# Patient Record
Sex: Male | Born: 1960 | Race: White | Hispanic: No | Smoking: Current every day smoker
Health system: Southern US, Community
[De-identification: ages and names within clinical notes are randomized; demographics above are authoritative.]

## PROBLEM LIST (undated history)

## (undated) DIAGNOSIS — J302 Other seasonal allergic rhinitis: Secondary | ICD-10-CM

## (undated) DIAGNOSIS — G4730 Sleep apnea, unspecified: Secondary | ICD-10-CM

## (undated) HISTORY — PX: TRIGGER FINGER RELEASE: SHX641

## (undated) HISTORY — PX: APPENDECTOMY: SHX54

---

## 2007-06-08 ENCOUNTER — Emergency Department (HOSPITAL_COMMUNITY): Admission: EM | Admit: 2007-06-08 | Discharge: 2007-06-08 | Payer: Self-pay | Admitting: Emergency Medicine

## 2010-10-31 ENCOUNTER — Emergency Department (HOSPITAL_COMMUNITY): Admission: EM | Admit: 2010-10-31 | Discharge: 2010-11-01 | Payer: Self-pay | Admitting: Emergency Medicine

## 2013-09-14 ENCOUNTER — Other Ambulatory Visit: Payer: Self-pay | Admitting: Otolaryngology

## 2013-09-18 NOTE — Addendum Note (Signed)
Addended by: Annalee Genta, Emonii Wienke on: 09/18/2013 05:45 PM   Modules accepted: Orders

## 2013-10-19 ENCOUNTER — Encounter (HOSPITAL_COMMUNITY): Payer: Self-pay | Admitting: Pharmacy Technician

## 2013-10-20 ENCOUNTER — Other Ambulatory Visit (HOSPITAL_COMMUNITY): Payer: Self-pay | Admitting: *Deleted

## 2013-10-20 NOTE — Pre-Procedure Instructions (Signed)
Gary Meyers  10/20/2013   Your procedure is scheduled on:  Wednesday, November 01, 2013 at 8:30 AM.   Report to [facility] Entrance "A" at 6:30 AM.   Call this number if you have problems the morning of surgery: [phone]   Remember:   Do not eat food or drink liquids after midnight Tuesday, 10/31/13.   Take these medicines the morning of surgery with A SIP OF WATER: azelastine (ASTELIN) 137 MCG/SPRAY, Aclidinium Bromide (TUDORZA PRESSAIR)    Do not wear jewelry.  Do not wear lotions, powders, or cologne. You may wear deodorant.  Do not shave 48 hours prior to surgery. Men may shave face and neck.  Do not bring valuables to the hospital.  [facility] is not responsible                  for any belongings or valuables.               Contacts, dentures or bridgework may not be worn into surgery.  Leave suitcase in the car. After surgery it may be brought to your room.  For patients admitted to the hospital, discharge time is determined by your                treatment team.             Special Instructions: Shower using CHG 2 nights before surgery and the night before surgery.  If you shower the day of surgery use CHG.  Use special wash - you have one bottle of CHG for all showers.  You should use approximately 1/3 of the bottle for each shower.   Please read over the following fact sheets that you were given: Pain Booklet, Coughing and Deep Breathing and Surgical Site Infection Prevention

## 2013-10-23 ENCOUNTER — Encounter (HOSPITAL_COMMUNITY)
Admission: RE | Admit: 2013-10-23 | Discharge: 2013-10-23 | Disposition: A | Discharge status: Home / Self Care | Source: Ambulatory Visit | Attending: Anesthesiology | Admitting: Anesthesiology

## 2013-10-23 ENCOUNTER — Encounter (HOSPITAL_COMMUNITY): Payer: Self-pay

## 2013-10-23 ENCOUNTER — Encounter (HOSPITAL_COMMUNITY)
Admission: RE | Admit: 2013-10-23 | Discharge: 2013-10-23 | Disposition: A | Discharge status: Home / Self Care | Source: Ambulatory Visit | Attending: Otolaryngology | Admitting: Otolaryngology

## 2013-10-23 DIAGNOSIS — Z01818 Encounter for other preprocedural examination: Secondary | ICD-10-CM | POA: Insufficient documentation

## 2013-10-23 DIAGNOSIS — Z01810 Encounter for preprocedural cardiovascular examination: Secondary | ICD-10-CM | POA: Insufficient documentation

## 2013-10-23 DIAGNOSIS — Z01812 Encounter for preprocedural laboratory examination: Secondary | ICD-10-CM | POA: Insufficient documentation

## 2013-10-23 HISTORY — DX: Other seasonal allergic rhinitis: J30.2

## 2013-10-23 HISTORY — DX: Sleep apnea, unspecified: G47.30

## 2013-10-23 LAB — CBC
Hemoglobin: 15.7 g/dL (ref 13.0–17.0)
MCV: 94.5 fL (ref 78.0–100.0)
Platelets: 200 10*3/uL (ref 150–400)
RBC: 4.69 MIL/uL (ref 4.22–5.81)
WBC: 6.3 10*3/uL (ref 4.0–10.5)

## 2013-10-23 LAB — BASIC METABOLIC PANEL
BUN: 14 mg/dL (ref 6–23)
CO2: 28 mEq/L (ref 19–32)
Calcium: 9.6 mg/dL (ref 8.4–10.5)
Chloride: 101 mEq/L (ref 96–112)
GFR calc non Af Amer: 73 mL/min — ABNORMAL LOW (ref >90)
Potassium: 4 mEq/L (ref 3.5–5.1)
Sodium: 139 mEq/L (ref 135–145)

## 2013-10-23 NOTE — Progress Notes (Signed)
Anesthesia Chart Review: Patient is a 52 year old male scheduled for nasal septoplasty and bilateral inferior turbinate reduction on 11/01/13 by Dr. Annalee Genta.  History includes OSA, seasonal allergies, smoking, appendectomy, trigger finger release.  He does not have a regular PCP.    EKG on 10/23/13 showed NSR.  CXR on 10/23/13 showed no radiographic evidence of acute cardiopulmonary disease.  Preoperative labs noted.  Preoperative diagnostic studies appear acceptable for OR.  Patient instructed by his PAT RN to bring CPAP mask on the day of surgery.    Velna Ochs [facility] Stay [facility]/Anesthesiology Phone [phone] 10/23/2013 3:07 PM

## 2013-10-23 NOTE — Progress Notes (Signed)
Instructed to bring CPAP mask day of surgery. Patient does not have a PCP. Requested sleep study from [facility].

## 2013-10-30 ENCOUNTER — Other Ambulatory Visit: Payer: Self-pay | Admitting: Otolaryngology

## 2013-10-31 MED ORDER — CEFAZOLIN SODIUM-DEXTROSE 2-3 GM-% IV SOLR
2.0000 g | INTRAVENOUS | Status: AC
Start: 2013-11-01 — End: 2013-11-01
  Administered 2013-11-01: 2 g via INTRAVENOUS
  Filled 2013-10-31: qty 50

## 2013-11-01 ENCOUNTER — Ambulatory Visit (HOSPITAL_COMMUNITY): Admitting: Anesthesiology

## 2013-11-01 ENCOUNTER — Encounter (HOSPITAL_COMMUNITY)
Admission: RE | Disposition: A | Discharge status: Home / Self Care | Payer: Self-pay | Source: Ambulatory Visit | Attending: Otolaryngology

## 2013-11-01 ENCOUNTER — Encounter (HOSPITAL_COMMUNITY): Admitting: Vascular Surgery

## 2013-11-01 ENCOUNTER — Encounter (HOSPITAL_COMMUNITY): Payer: Self-pay | Admitting: Anesthesiology

## 2013-11-01 ENCOUNTER — Ambulatory Visit (HOSPITAL_COMMUNITY)
Admission: RE | Admit: 2013-11-01 | Discharge: 2013-11-01 | Disposition: A | Discharge status: Home / Self Care | Source: Ambulatory Visit | Attending: Otolaryngology | Admitting: Otolaryngology

## 2013-11-01 DIAGNOSIS — G4733 Obstructive sleep apnea (adult) (pediatric): Secondary | ICD-10-CM | POA: Insufficient documentation

## 2013-11-01 DIAGNOSIS — J342 Deviated nasal septum: Secondary | ICD-10-CM | POA: Insufficient documentation

## 2013-11-01 DIAGNOSIS — J343 Hypertrophy of nasal turbinates: Secondary | ICD-10-CM | POA: Insufficient documentation

## 2013-11-01 HISTORY — PX: NASAL SEPTOPLASTY W/ TURBINOPLASTY: SHX2070

## 2013-11-01 SURGERY — SEPTOPLASTY, NOSE, WITH NASAL TURBINATE REDUCTION
Anesthesia: General | Site: Nose | Laterality: Bilateral | Wound class: Clean Contaminated

## 2013-11-01 MED ORDER — ROCURONIUM BROMIDE 100 MG/10ML IV SOLN
INTRAVENOUS | Status: DC | PRN
  Administered 2013-11-01: 50 mg via INTRAVENOUS

## 2013-11-01 MED ORDER — OXYCODONE HCL 5 MG/5ML PO SOLN: 5.0000 mg | Freq: Once | ORAL | Status: DC | PRN

## 2013-11-01 MED ORDER — NEOSTIGMINE METHYLSULFATE 1 MG/ML IJ SOLN
INTRAMUSCULAR | Status: DC | PRN
  Administered 2013-11-01: 4 mg via INTRAVENOUS

## 2013-11-01 MED ORDER — PROPOFOL 10 MG/ML IV BOLUS
INTRAVENOUS | Status: DC | PRN
  Administered 2013-11-01: 200 mg via INTRAVENOUS

## 2013-11-01 MED ORDER — LIDOCAINE HCL (CARDIAC) 20 MG/ML IV SOLN
INTRAVENOUS | Status: DC | PRN
  Administered 2013-11-01: 100 mg via INTRAVENOUS

## 2013-11-01 MED ORDER — MIDAZOLAM HCL 5 MG/5ML IJ SOLN
INTRAMUSCULAR | Status: DC | PRN
  Administered 2013-11-01: 2 mg via INTRAVENOUS

## 2013-11-01 MED ORDER — HYDROMORPHONE HCL PF 1 MG/ML IJ SOLN
0.2500 mg | INTRAMUSCULAR | Status: DC | PRN
  Administered 2013-11-01 (×2): 0.5 mg via INTRAVENOUS

## 2013-11-01 MED ORDER — DEXAMETHASONE SODIUM PHOSPHATE 10 MG/ML IJ SOLN
10.0000 mg | Freq: Once | INTRAMUSCULAR | Status: AC
  Administered 2013-11-01: 10 mg via INTRAVENOUS
  Filled 2013-11-01: qty 1

## 2013-11-01 MED ORDER — MUPIROCIN CALCIUM 2 % EX CREA
TOPICAL_CREAM | CUTANEOUS | Status: AC
  Filled 2013-11-01: qty 15

## 2013-11-01 MED ORDER — OXYMETAZOLINE HCL 0.05 % NA SOLN
NASAL | Status: DC | PRN
  Administered 2013-11-01: 1 via NASAL

## 2013-11-01 MED ORDER — ONDANSETRON HCL 4 MG/2ML IJ SOLN: 4.0000 mg | Freq: Once | INTRAMUSCULAR | Status: DC | PRN

## 2013-11-01 MED ORDER — LIDOCAINE-EPINEPHRINE 1 %-1:100000 IJ SOLN
INTRAMUSCULAR | Status: AC
  Filled 2013-11-01: qty 1

## 2013-11-01 MED ORDER — HYDROCODONE-ACETAMINOPHEN 5-325 MG PO TABS: 1.0000 | ORAL_TABLET | Freq: Four times a day (QID) | ORAL | Status: AC | PRN

## 2013-11-01 MED ORDER — LIDOCAINE-EPINEPHRINE 1 %-1:100000 IJ SOLN
INTRAMUSCULAR | Status: DC | PRN
  Administered 2013-11-01: 10 mL

## 2013-11-01 MED ORDER — 0.9 % SODIUM CHLORIDE (POUR BTL) OPTIME
TOPICAL | Status: DC | PRN
  Administered 2013-11-01: 1000 mL

## 2013-11-01 MED ORDER — LACTATED RINGERS IV SOLN
INTRAVENOUS | Status: DC | PRN
  Administered 2013-11-01: 08:00:00 via INTRAVENOUS

## 2013-11-01 MED ORDER — OXYMETAZOLINE HCL 0.05 % NA SOLN
NASAL | Status: AC
  Filled 2013-11-01: qty 15

## 2013-11-01 MED ORDER — FENTANYL CITRATE 0.05 MG/ML IJ SOLN
INTRAMUSCULAR | Status: DC | PRN
  Administered 2013-11-01: 100 ug via INTRAVENOUS

## 2013-11-01 MED ORDER — OXYCODONE HCL 5 MG PO TABS: 5.0000 mg | ORAL_TABLET | Freq: Once | ORAL | Status: DC | PRN

## 2013-11-01 MED ORDER — MUPIROCIN CALCIUM 2 % EX CREA
TOPICAL_CREAM | CUTANEOUS | Status: DC | PRN
  Administered 2013-11-01: 1 via TOPICAL

## 2013-11-01 MED ORDER — GLYCOPYRROLATE 0.2 MG/ML IJ SOLN
INTRAMUSCULAR | Status: DC | PRN
  Administered 2013-11-01: 0.6 mg via INTRAVENOUS

## 2013-11-01 MED ORDER — MEPERIDINE HCL 25 MG/ML IJ SOLN: 6.2500 mg | INTRAMUSCULAR | Status: DC | PRN

## 2013-11-01 MED ORDER — ONDANSETRON HCL 4 MG/2ML IJ SOLN
INTRAMUSCULAR | Status: DC | PRN
  Administered 2013-11-01: 4 mg via INTRAVENOUS

## 2013-11-01 MED ORDER — AMOXICILLIN-POT CLAVULANATE 500-125 MG PO TABS: 1.0000 | ORAL_TABLET | Freq: Two times a day (BID) | ORAL | Status: AC

## 2013-11-01 SURGICAL SUPPLY — 24 items
CANISTER SUCTION 2500CC (MISCELLANEOUS) ×2 IMPLANT
COAGULATOR SUCT SWTCH 10FR 6 (ELECTROSURGICAL) IMPLANT
ELECT REM PT RETURN 9FT ADLT (ELECTROSURGICAL) ×2
ELECTRODE REM PT RTRN 9FT ADLT (ELECTROSURGICAL) IMPLANT
GAUZE SPONGE 2X2 8PLY STRL LF (GAUZE/BANDAGES/DRESSINGS) ×1 IMPLANT
GLOVE BIOGEL M 7.0 STRL (GLOVE) ×4 IMPLANT
GLOVE BIOGEL PI IND STRL 6.5 (GLOVE) IMPLANT
GLOVE BIOGEL PI INDICATOR 6.5 (GLOVE) ×1
GLOVE SURG SS PI 6.5 STRL IVOR (GLOVE) ×1 IMPLANT
GOWN STRL NON-REIN LRG LVL3 (GOWN DISPOSABLE) ×4 IMPLANT
KIT BASIN OR (CUSTOM PROCEDURE TRAY) ×2 IMPLANT
KIT ROOM TURNOVER OR (KITS) ×2 IMPLANT
NS IRRIG 1000ML POUR BTL (IV SOLUTION) ×2 IMPLANT
PAD ARMBOARD 7.5X6 YLW CONV (MISCELLANEOUS) ×3 IMPLANT
SPLINT NASAL DOYLE BI-VL (GAUZE/BANDAGES/DRESSINGS) ×2 IMPLANT
SPONGE GAUZE 2X2 STER 10/PKG (GAUZE/BANDAGES/DRESSINGS)
SPONGE NEURO XRAY DETECT 1X3 (DISPOSABLE) ×2 IMPLANT
SUT ETHILON 3 0 PS 1 (SUTURE) ×2 IMPLANT
SUT PLAIN 4 0 ~~LOC~~ 1 (SUTURE) ×2 IMPLANT
TOWEL OR 17X24 6PK STRL BLUE (TOWEL DISPOSABLE) ×2 IMPLANT
TOWEL OR 17X26 10 PK STRL BLUE (TOWEL DISPOSABLE) ×2 IMPLANT
TRAY ENT MC OR (CUSTOM PROCEDURE TRAY) ×2 IMPLANT
TUBE SALEM SUMP 16 FR W/ARV (TUBING) ×2 IMPLANT
TUBING EXTENTION W/L.L. (IV SETS) ×2 IMPLANT

## 2013-11-01 NOTE — Preoperative (Signed)
Beta Blockers   Reason not to administer Beta Blockers:Not Applicable 

## 2013-11-01 NOTE — Anesthesia Preprocedure Evaluation (Addendum)
Anesthesia Evaluation  Patient identified by MRN, date of birth, ID band Patient awake    Reviewed: Allergy & Precautions, H&P , NPO status , Patient's Chart, lab work & pertinent test results  Airway Mallampati: II TM Distance: >3 FB Neck ROM: Full    Dental  (+) Teeth Intact   Pulmonary sleep apnea and Continuous Positive Airway Pressure Ventilation , Current Smoker,          Cardiovascular     Neuro/Psych    GI/Hepatic   Endo/Other    Renal/GU      Musculoskeletal   Abdominal   Peds  Hematology   Anesthesia Other Findings   Reproductive/Obstetrics                         Anesthesia Physical Anesthesia Plan  ASA: II  Anesthesia Plan: General   Post-op Pain Management:    Induction: Intravenous  Airway Management Planned: Oral ETT  Additional Equipment:   Intra-op Plan:   Post-operative Plan: Extubation in OR  Informed Consent: I have reviewed the patients History and Physical, chart, labs and discussed the procedure including the risks, benefits and alternatives for the proposed anesthesia with the patient or authorized representative who has indicated his/her understanding and acceptance.   Dental advisory given  Plan Discussed with: CRNA  Anesthesia Plan Comments:        Anesthesia Quick Evaluation

## 2013-11-01 NOTE — Anesthesia Procedure Notes (Signed)
Procedure Name: Intubation Date/Time: 11/01/2013 8:47 AM Performed by: Quentin Ore Pre-anesthesia Checklist: Patient identified, Emergency Drugs available, Suction available, Patient being monitored and Timeout performed Patient Re-evaluated:Patient Re-evaluated prior to inductionOxygen Delivery Method: Circle system utilized Preoxygenation: Pre-oxygenation with 100% oxygen Intubation Type: IV induction Ventilation: Mask ventilation without difficulty Laryngoscope Size: Mac and 3 Grade View: Grade III Tube type: Oral Tube size: 7.5 mm Number of attempts: 1 Airway Equipment and Method: Stylet Placement Confirmation: ETT inserted through vocal cords under direct vision,  positive ETCO2 and breath sounds checked- equal and bilateral Secured at: 23 cm Tube secured with: Tape Dental Injury: Teeth and Oropharynx as per pre-operative assessment

## 2013-11-01 NOTE — Brief Op Note (Signed)
11/01/2013  9:50 AM  PATIENT:  Gary Meyers  52 y.o. male  PRE-OPERATIVE DIAGNOSIS:  DEVIATED NASAL SEPTUM/OBSTRUCTIVE SLEEP APNEA  POST-OPERATIVE DIAGNOSIS:  DEVIATED NASAL SEPTUM  PROCEDURE:  Procedure(s): NASAL SEPTOPLASTY WITH TURBINATE REDUCTION (Bilateral)  SURGEON:  Surgeon(s) and Role:    * Osborn Coho, MD - Primary  PHYSICIAN ASSISTANT:   ASSISTANTS: none   ANESTHESIA:   general  EBL:    50 cc  BLOOD ADMINISTERED:none  DRAINS: none   LOCAL MEDICATIONS USED:  LIDOCAINE  and Amount: 50 ml  SPECIMEN:  No Specimen  DISPOSITION OF SPECIMEN:  N/A  COUNTS:  YES  TOURNIQUET:  * No tourniquets in log *  DICTATION: .Other Dictation: Dictation Number [id number]  PLAN OF CARE: Discharge to home after PACU  PATIENT DISPOSITION:  PACU - hemodynamically stable.   Delay start of Pharmacological VTE agent (>24hrs) due to surgical blood loss or risk of bleeding: not applicable

## 2013-11-01 NOTE — Anesthesia Postprocedure Evaluation (Signed)
Anesthesia Post Note  Patient: Gary Meyers  Procedure(s) Performed: Procedure(s) (LRB): NASAL SEPTOPLASTY WITH TURBINATE REDUCTION (Bilateral)  Anesthesia type: general  Patient location: PACU  Post pain: Pain level controlled  Post assessment: Patient's Cardiovascular Status Stable  Last Vitals:  Filed Vitals:   11/01/13 1056  BP: 117/76  Pulse: 65  Temp: 36.6 C  Resp:     Post vital signs: Reviewed and stable  Level of consciousness: sedated  Complications: No apparent anesthesia complications

## 2013-11-01 NOTE — H&P (Signed)
Gary Meyers is an 52 y.o. male.   Chief Complaint: Nasal Airway Obstruction HPI: Hx of prog nasal obstruction, mild OSA, unable to tol CPAP.   Past Medical History  Diagnosis Date  . Seasonal allergies     taking shot at MD office  . Sleep apnea     uses CPAP    Past Surgical History  Procedure Laterality Date  . Appendectomy    . Trigger finger release Right     Thumb trigger finger release    No family history on file. Social History:  reports that he has been smoking Cigarettes.  He has a 30 pack-year smoking history. He has never used smokeless tobacco. He reports that he drinks alcohol. He reports that he does not use illicit drugs.  Allergies: No Known Allergies  Medications Prior to Admission  Medication Sig Dispense Refill  . Aclidinium Bromide (TUDORZA PRESSAIR) 400 MCG/ACT AEPB Inhale 1 puff into the lungs 2 (two) times daily.      Marland Kitchen azelastine (ASTELIN) 137 MCG/SPRAY nasal spray Place 1 spray into the nose every evening. Use in each nostril as directed      . PRESCRIPTION MEDICATION once a week. Friday Allergy injection        No results found for this or any previous visit (from the past 48 hour(s)). No results found.  Review of Systems  Constitutional: Negative.   HENT: Negative.   Respiratory: Negative.   Cardiovascular: Negative.   Gastrointestinal: Negative.   Musculoskeletal: Negative.   Neurological: Negative.     Blood pressure 145/76, pulse 75, temperature 97.5 F (36.4 C), temperature source Oral, resp. rate 20, SpO2 100.00%. Physical Exam  Constitutional: He is oriented to person, place, and time. He appears well-developed and well-nourished.  HENT:  Nose: Septal deviation present.  Neck: Normal range of motion. Neck supple.  Cardiovascular: Normal rate.   Respiratory: Effort normal.  GI: Soft.  Musculoskeletal: Normal range of motion.  Neurological: He is alert and oriented to person, place, and time.     Assessment/Plan ADM for  OP septoplasty and IT reduction.  Allante Whitmire 11/01/2013, 8:34 AM

## 2013-11-01 NOTE — Transfer of Care (Signed)
Immediate Anesthesia Transfer of Care Note  Patient: Gary Meyers  Procedure(s) Performed: Procedure(s): NASAL SEPTOPLASTY WITH TURBINATE REDUCTION (Bilateral)  Patient Location: PACU  Anesthesia Type:General  Level of Consciousness: awake, alert  and oriented  Airway & Oxygen Therapy: Patient Spontanous Breathing and Patient connected to face mask oxygen  Post-op Assessment: Report given to PACU RN, Post -op Vital signs reviewed and stable and Patient moving all extremities X 4  Post vital signs: Reviewed and stable  Complications: No apparent anesthesia complications

## 2013-11-02 ENCOUNTER — Encounter (HOSPITAL_COMMUNITY): Payer: Self-pay | Admitting: Otolaryngology

## 2013-11-02 NOTE — Op Note (Signed)
NAMEMARX, DOIG               ACCOUNT NO.:  [id number]  MEDICAL RECORD NO.:  [id number]  LOCATION:  [facility]                         FACILITY:  [facility]  PHYSICIAN:  Kinnie Scales. Annalee Genta, M.D.DATE OF BIRTH:  01-12-1961  DATE OF PROCEDURE:  11/01/2013 DATE OF DISCHARGE:  11/01/2013                              OPERATIVE REPORT   PREOPERATIVE DIAGNOSES: 1. Nasal septal deviation with airway obstruction. 2. Inferior turbinate hypertrophy. 3. Obstructive sleep apnea.  POSTOPERATIVE DIAGNOSES: 1. Nasal septal deviation with airway obstruction. 2. Inferior turbinate hypertrophy. 3. Obstructive sleep apnea.  INDICATIONS FOR SURGERY: 1. Nasal septal deviation with airway obstruction. 2. Inferior turbinate hypertrophy. 3. Obstructive sleep apnea.  SURGICAL PROCEDURES: 1. Nasal septoplasty. 2. Bilateral inferior turbinate reduction.  ANESTHESIA:  General endotracheal.  SURGEON:  Kinnie Scales. Annalee Genta, MD  COMPLICATIONS:  None.  ESTIMATED BLOOD LOSS:  Approximately 50 mL.  The patient was transferred from the operating room to the recovery room in stable condition.  BRIEF HISTORY:  The patient is a 52 year old white male, referred with a history of progressive symptoms of nasal airway obstruction and obstructive sleep apnea.  The patient had undergone previous sleep study which showed an RDI of 30 events per hour with an O2 nadir of 87%.  The patient was prescribed a CPAP mask which he has had significant difficulty wearing because of progressive symptoms of nasal airway obstruction.  The patient has a history of nasal trauma.  Examination in the office revealed a severely deviated nasal septum with near complete obstruction on the left nasal cavity and bilateral inferior turbinate hypertrophy.  Given his history and physical findings, I recommended he undertake nasal septoplasty and turbinate reduction to improve nasal airway patency and CPAP compliance.  The risks and benefits of  the procedure were discussed in detail with the patient and his family and they understood and concurred with our plan for surgery which is scheduled on elective basis as an outpatient at [facility] OR.  SURGICAL PROCEDURE:  The patient was brought to the operating room, placed in the supine position on the operating table.  General endotracheal anesthesia was established without difficulty.  When the patient adequately anesthetized, he was positioned on the operating table and prepped and draped in a sterile fashion.  His nose was injected with 8 mL of 1% lidocaine 1:100,000 solution with epinephrine injected in submucosal fashion along the nasal septum and inferior turbinates bilaterally.  The patient's nose was then packed with Afrin soaked cottonoid pledgets which were left in place for approximately 10 minutes to allow for vasoconstriction and hemostasis.  The patient was positioned, prepped and draped, and the procedure was begun.  With the patient prepared for surgery, a left anterior hemitransfixion incision was created and a mucoperichondrial flap was elevated on the left-hand side.  The anterior cartilaginous septum was crossed to the midline and mucoperichondrial flap was elevated on the right.  The patient had a severely deviated nasal septal cartilage and bone with complete obstruction of the left nasal passageway and large bony septal spur, projecting into the left nasal passageway.  The anterior and mid septal cartilage was removed.  This was later morselized and returned to  the mucoperichondrial pocket.  Dissection was then carried from anterior to posterior using through cutting forceps and a 4-mm osteotome to mobilize deviated bone and cartilage.  The overlying mucosa was preserved and the deviated tissue was removed.  This brought the septum to a good midline position.  The morselized cartilage was returned to the mucoperichondrial pocket and the flaps  were reapproximated with a 4- 0 gut suture on a Keith needle in a horizontal mattressing fashion.  At the conclusion of the procedure, bilateral Doyle nasal septal splints were placed after the application of Bactroban ointment and these were sutured in position with a 3-0 Ethilon suture.  Inferior turbinate reduction was then performed with cautery set at 12 watts.  Two submucosal passes were made in each inferior turbinate. When the turbinates had been adequately cauterized, anterior incisions were created, overlying soft tissue elevated, and small amount of turbinate bone was resected.  The turbinates were then outfractured to maximize nasal patency.  The patient's nasal cavity was irrigated and suctioned.  There was no bleeding.  An orogastric tube was passed and the stomach contents were aspirated.  The patient was then awakened from his anesthetic.  He was extubated and transferred from the operating room to the recovery room in stable condition.  Blood loss 50 mL.  No complications.          ______________________________ Kinnie Scales Annalee Genta, M.D.     DLS/MEDQ  D:  16/09/9603  T:  11/02/2013  Job:  [id number]

## 2015-07-03 IMAGING — CR DG CHEST 2V
3 series · 3 of 3 positions shown · non-contrast
Comparison: No priors.

CLINICAL DATA: Sleep apnea.  Preadmission film.

EXAM:
CHEST  2 VIEW

[w chest pa]
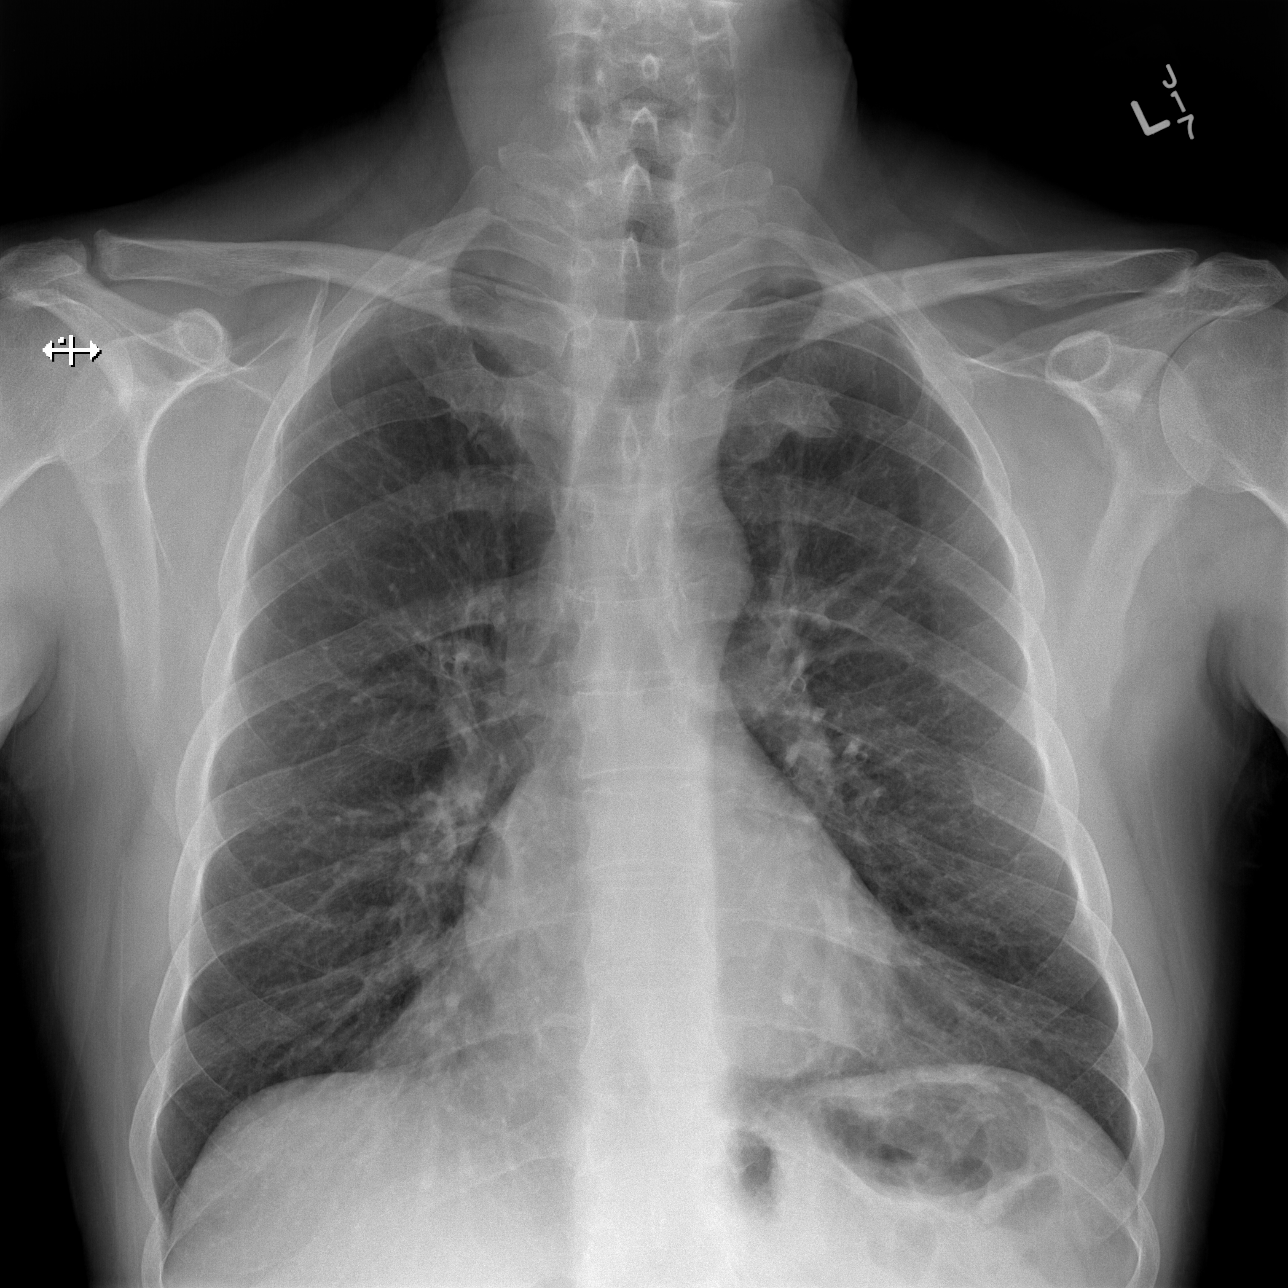

[w chest lat (1 of 2)]
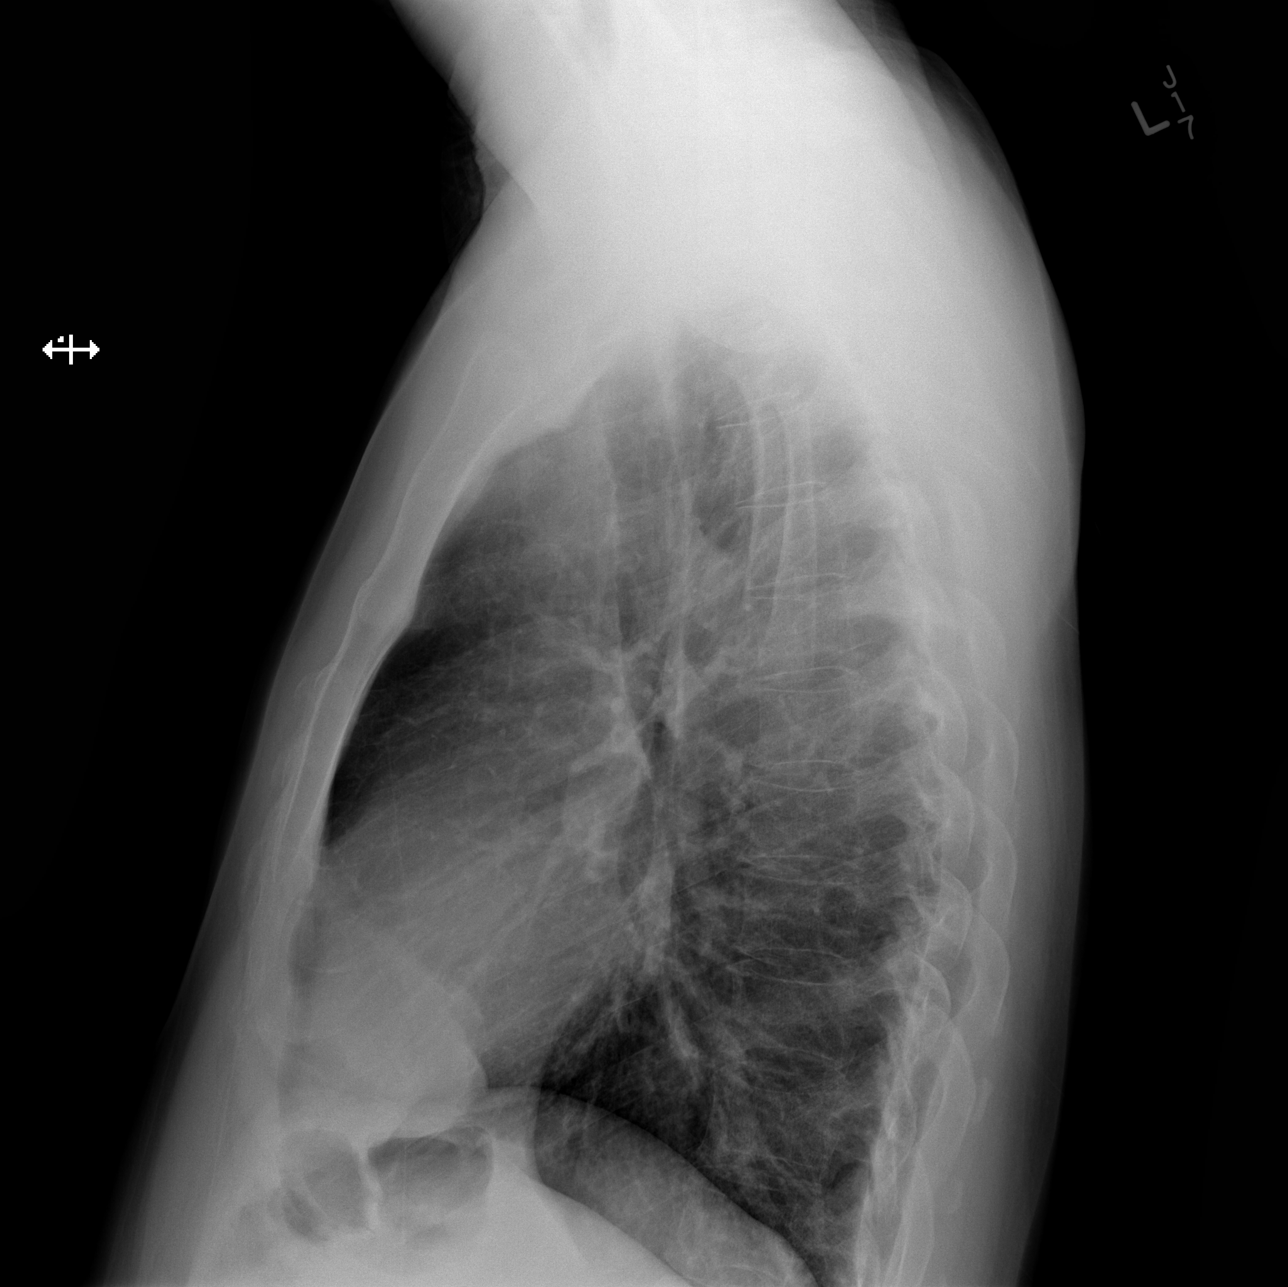

[w chest lat (2 of 2)]
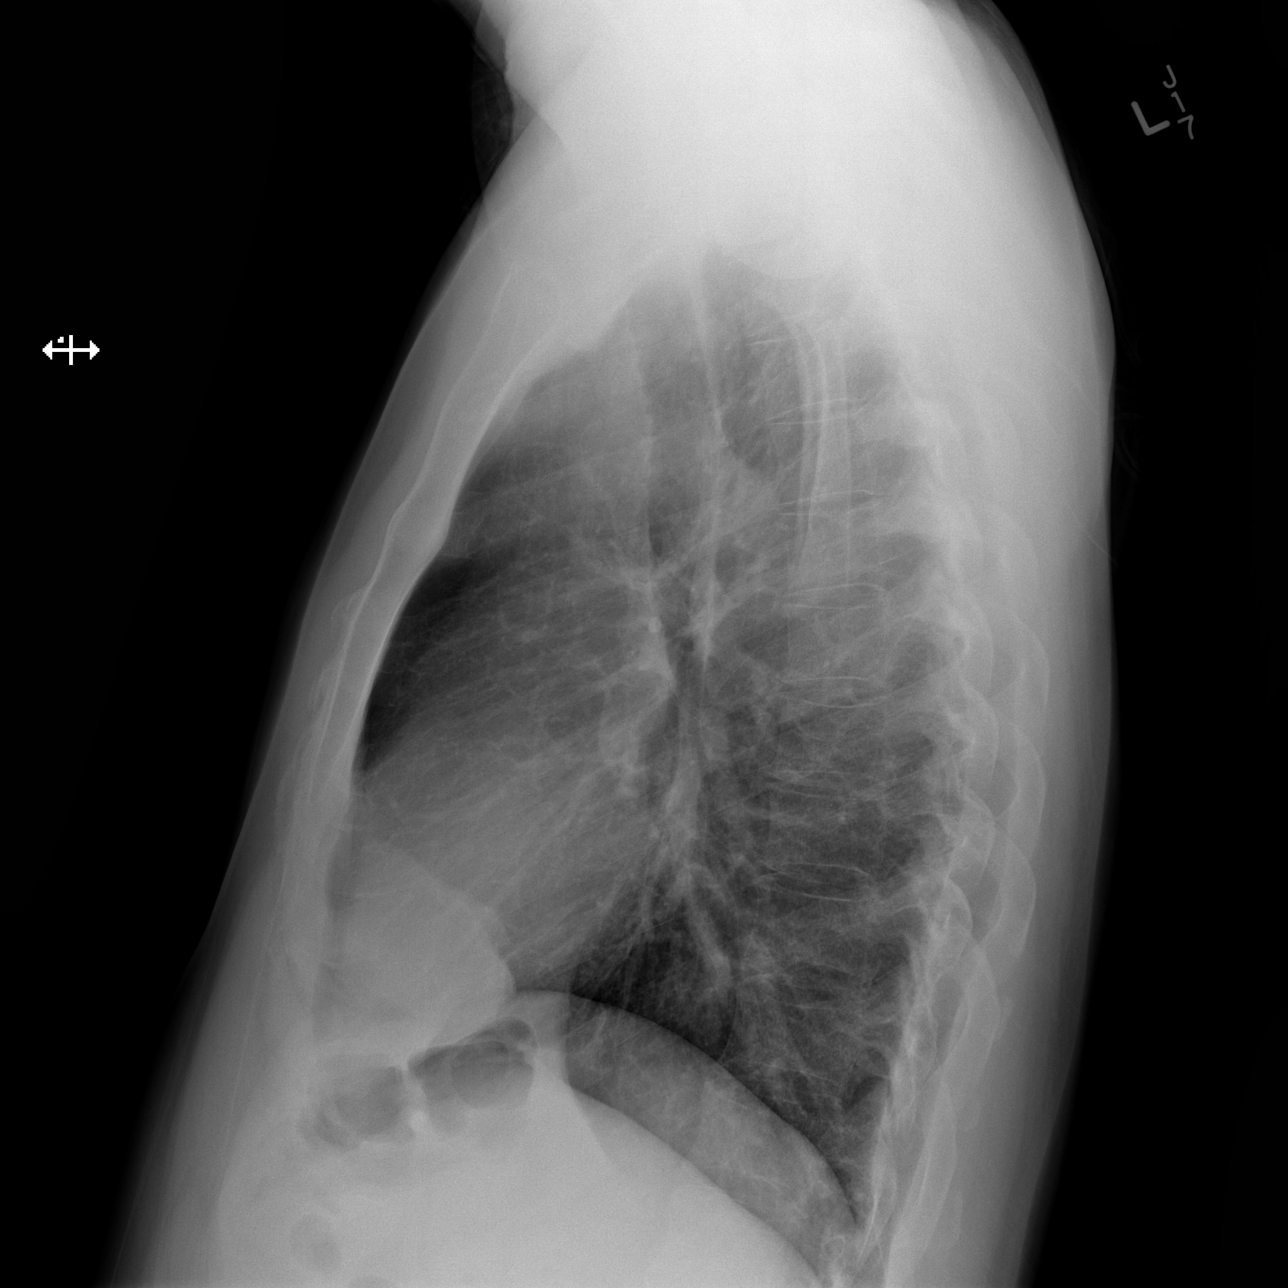

[3 of 3 positions shown; findings below may reference images not displayed]

FINDINGS: Lung volumes are normal. No consolidative airspace disease. No
pleural effusions. No pneumothorax. No pulmonary nodule or mass
noted. Pulmonary vasculature and the cardiomediastinal silhouette
are within normal limits.
IMPRESSION: 1.  No radiographic evidence of acute cardiopulmonary disease.

## 2020-04-10 ENCOUNTER — Telehealth: Payer: Self-pay | Admitting: Internal Medicine

## 2020-04-10 NOTE — Telephone Encounter (Signed)
Pharmacy did not contact patient.  Does not look like he is a patient of [facility].  I believe this was enter into the incorrect patient chart.  Please advise.

## 2020-04-10 NOTE — Telephone Encounter (Signed)
Will close this encounter.
